# Patient Record
Sex: Male | Born: 1955 | Race: White | Hispanic: No | Marital: Single | Smoking: Current every day smoker
Health system: Southern US, Community
[De-identification: ages and names within clinical notes are randomized; demographics above are authoritative.]

---

## 2018-11-14 ENCOUNTER — Encounter (HOSPITAL_COMMUNITY): Payer: Self-pay | Admitting: Emergency Medicine

## 2018-11-14 ENCOUNTER — Emergency Department (HOSPITAL_COMMUNITY): Payer: Self-pay

## 2018-11-14 ENCOUNTER — Emergency Department (HOSPITAL_COMMUNITY)
Admission: EM | Admit: 2018-11-14 | Discharge: 2018-11-15 | Disposition: A | Discharge status: Home / Self Care | Payer: Self-pay | Attending: Emergency Medicine | Admitting: Emergency Medicine

## 2018-11-14 ENCOUNTER — Other Ambulatory Visit: Payer: Self-pay

## 2018-11-14 DIAGNOSIS — W19XXXA Unspecified fall, initial encounter: Secondary | ICD-10-CM | POA: Insufficient documentation

## 2018-11-14 DIAGNOSIS — Y999 Unspecified external cause status: Secondary | ICD-10-CM | POA: Insufficient documentation

## 2018-11-14 DIAGNOSIS — Y9289 Other specified places as the place of occurrence of the external cause: Secondary | ICD-10-CM | POA: Insufficient documentation

## 2018-11-14 DIAGNOSIS — F17210 Nicotine dependence, cigarettes, uncomplicated: Secondary | ICD-10-CM | POA: Insufficient documentation

## 2018-11-14 DIAGNOSIS — S80212A Abrasion, left knee, initial encounter: Secondary | ICD-10-CM | POA: Insufficient documentation

## 2018-11-14 DIAGNOSIS — S80211A Abrasion, right knee, initial encounter: Secondary | ICD-10-CM | POA: Insufficient documentation

## 2018-11-14 DIAGNOSIS — Y9389 Activity, other specified: Secondary | ICD-10-CM | POA: Insufficient documentation

## 2018-11-14 DIAGNOSIS — R55 Syncope and collapse: Secondary | ICD-10-CM | POA: Insufficient documentation

## 2018-11-14 DIAGNOSIS — F10920 Alcohol use, unspecified with intoxication, uncomplicated: Secondary | ICD-10-CM | POA: Insufficient documentation

## 2018-11-14 DIAGNOSIS — Y909 Presence of alcohol in blood, level not specified: Secondary | ICD-10-CM | POA: Insufficient documentation

## 2018-11-14 DIAGNOSIS — M546 Pain in thoracic spine: Secondary | ICD-10-CM | POA: Insufficient documentation

## 2018-11-14 MED ORDER — SODIUM CHLORIDE 0.9 % IV BOLUS
1000.0000 mL | Freq: Once | INTRAVENOUS | Status: AC
  Administered 2018-11-14: 1000 mL via INTRAVENOUS

## 2018-11-14 NOTE — ED Notes (Signed)
Pt ripping VS monitoring off and refusing to leave it on.

## 2018-11-14 NOTE — ED Notes (Signed)
Pt given apple juice and turkey sandwich

## 2018-11-14 NOTE — ED Notes (Addendum)
Pt refusing VS monitoring at this time still.

## 2018-11-14 NOTE — ED Triage Notes (Signed)
[place] here via [facility], from [place], ETOH on board, multiple witnessed falls and did not hit head per bystanders, pt does not remember fall. A&O x 4.
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
## 2018-11-14 NOTE — ED Notes (Signed)
Patient transported to X-ray 
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
## 2018-11-14 NOTE — ED Notes (Signed)
Amb. Pt in hall, starting O2 sats were 97, after ambulation  O2 sats were 92, pt tolerated walking well.

## 2018-11-15 NOTE — ED Notes (Signed)
Pt ambulatory to the bathroom without difficulty.

## 2018-11-15 NOTE — Discharge Instructions (Addendum)
You had x-rays of your head, neck, knees and back.  CT scan of your neck shows arthritis.  Avoid alcohol.

## 2018-11-15 NOTE — ED Notes (Signed)
Patient verbalizes understanding of discharge instructions. Opportunity for questioning and answers were provided. Armband removed by staff, pt discharged from ED.  
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
